# Patient Record
Sex: Male | Born: 1960 | Race: White | Hispanic: No | Marital: Married | Smoking: Current every day smoker
Health system: Southern US, Community
[De-identification: ages and names within clinical notes are randomized; demographics above are authoritative.]

## PROBLEM LIST (undated history)

## (undated) DIAGNOSIS — G459 Transient cerebral ischemic attack, unspecified: Secondary | ICD-10-CM

## (undated) DIAGNOSIS — I639 Cerebral infarction, unspecified: Secondary | ICD-10-CM

---

## 2005-02-26 ENCOUNTER — Ambulatory Visit: Payer: Self-pay | Admitting: Internal Medicine

## 2005-06-01 ENCOUNTER — Other Ambulatory Visit: Payer: Self-pay

## 2005-06-01 ENCOUNTER — Inpatient Hospital Stay: Payer: Self-pay | Admitting: Internal Medicine

## 2007-11-15 ENCOUNTER — Emergency Department: Payer: Self-pay | Admitting: Emergency Medicine

## 2009-06-09 ENCOUNTER — Inpatient Hospital Stay (HOSPITAL_COMMUNITY): Admission: EM | Admit: 2009-06-09 | Discharge: 2009-06-10 | Payer: Self-pay | Admitting: Emergency Medicine

## 2009-06-09 ENCOUNTER — Ambulatory Visit: Payer: Self-pay | Admitting: Family Medicine

## 2009-07-07 ENCOUNTER — Inpatient Hospital Stay: Payer: Self-pay | Admitting: Internal Medicine

## 2010-06-05 ENCOUNTER — Other Ambulatory Visit: Admission: RE | Admit: 2010-06-05 | Discharge: 2010-06-05 | Payer: Self-pay | Admitting: Otolaryngology

## 2011-03-22 LAB — CBC
HCT: 47.3 % (ref 39.0–52.0)
HCT: 48.6 % (ref 39.0–52.0)
Hemoglobin: 16.1 g/dL (ref 13.0–17.0)
MCHC: 34.1 g/dL (ref 30.0–36.0)
MCV: 92.5 fL (ref 78.0–100.0)
MCV: 92.6 fL (ref 78.0–100.0)
Platelets: 286 10*3/uL (ref 150–400)
RBC: 5.11 MIL/uL (ref 4.22–5.81)
RBC: 5.25 MIL/uL (ref 4.22–5.81)
RDW: 16.5 % — ABNORMAL HIGH (ref 11.5–15.5)
WBC: 10.3 10*3/uL (ref 4.0–10.5)

## 2011-03-22 LAB — RAPID URINE DRUG SCREEN, HOSP PERFORMED
Amphetamines: NOT DETECTED
Benzodiazepines: NOT DETECTED
Cocaine: NOT DETECTED
Opiates: NOT DETECTED
Tetrahydrocannabinol: NOT DETECTED

## 2011-03-22 LAB — COMPREHENSIVE METABOLIC PANEL
ALT: 17 U/L (ref 0–53)
AST: 22 U/L (ref 0–37)
Alkaline Phosphatase: 67 U/L (ref 39–117)
BUN: 9 mg/dL (ref 6–23)
CO2: 22 mEq/L (ref 19–32)
CO2: 31 mEq/L (ref 19–32)
Calcium: 8.9 mg/dL (ref 8.4–10.5)
Chloride: 107 mEq/L (ref 96–112)
Creatinine, Ser: 0.91 mg/dL (ref 0.4–1.5)
GFR calc Af Amer: >60 mL/min (ref >60)
GFR calc non Af Amer: >60 mL/min (ref >60)
GFR calc non Af Amer: >60 mL/min (ref >60)
Glucose, Bld: 97 mg/dL (ref 70–99)
Sodium: 140 mEq/L (ref 135–145)
Total Bilirubin: 0.7 mg/dL (ref 0.3–1.2)
Total Protein: 5.9 g/dL — ABNORMAL LOW (ref 6.0–8.3)

## 2011-03-22 LAB — DIFFERENTIAL
Basophils Absolute: 0.1 10*3/uL (ref 0.0–0.1)
Eosinophils Absolute: 0.2 10*3/uL (ref 0.0–0.7)
Eosinophils Relative: 2 % (ref 0–5)

## 2011-03-22 LAB — POCT I-STAT, CHEM 8
Chloride: 107 mEq/L (ref 96–112)
Creatinine, Ser: 0.8 mg/dL (ref 0.4–1.5)
Glucose, Bld: 104 mg/dL — ABNORMAL HIGH (ref 70–99)
HCT: 52 % (ref 39.0–52.0)
Potassium: 3.9 mEq/L (ref 3.5–5.1)

## 2011-03-22 LAB — POCT CARDIAC MARKERS
CKMB, poc: <1 ng/mL — ABNORMAL LOW (ref 1.0–8.0)
Myoglobin, poc: 84.4 ng/mL (ref 12–200)
Troponin i, poc: <0.05 ng/mL (ref 0.00–0.09)

## 2011-03-22 LAB — URINALYSIS, ROUTINE W REFLEX MICROSCOPIC
Bilirubin Urine: NEGATIVE
Nitrite: NEGATIVE
Protein, ur: NEGATIVE mg/dL
Specific Gravity, Urine: 1.026 (ref 1.005–1.030)
Urobilinogen, UA: 0.2 mg/dL (ref 0.0–1.0)

## 2011-03-22 LAB — HEMOGLOBIN A1C: Hgb A1c MFr Bld: 5.2 % (ref 4.6–6.1)

## 2011-03-22 LAB — LIPID PANEL
Cholesterol: 173 mg/dL (ref 0–200)
LDL Cholesterol: 118 mg/dL — ABNORMAL HIGH (ref 0–99)

## 2011-03-22 LAB — PROTIME-INR
INR: 1 (ref 0.00–1.49)
Prothrombin Time: 13.2 seconds (ref 11.6–15.2)
Prothrombin Time: 13.6 seconds (ref 11.6–15.2)

## 2011-03-22 LAB — CARDIAC PANEL(CRET KIN+CKTOT+MB+TROPI): CK, MB: 2.9 ng/mL (ref 0.3–4.0)

## 2011-03-22 LAB — URINE MICROSCOPIC-ADD ON

## 2011-04-27 NOTE — H&P (Signed)
Timothy Griffin, Timothy Griffin             ACCOUNT NO.:  [id number]   MEDICAL RECORD NO.:  [id number]          PATIENT TYPE:  INP   LOCATION:  3735                         FACILITY:  [facility]   PHYSICIAN:  Timothy Griffin, Timothy GriffinDATE OF BIRTH:  [birth year]   DATE OF ADMISSION:  06/09/2009  DATE OF DISCHARGE:                              HISTORY & PHYSICAL   PRIMARY CARE PHYSICIAN:  The patient is unassigned but has been followed  by Timothy Griffin, Internal Medicine.   CHIEF COMPLAINT:  Chest pain.   HISTORY OF PRESENT ILLNESS:  Timothy Griffin is a 50 year old male with a  history of right-sided TIA in 2003, hyperlipidemia, and family history  of coronary artery disease that presented with complaint of substernal  chest pain at rest that began yesterday morning that has waxed and waned  and today began having left-sided upper and lower extremity weakness  that is slowly improved.  He has had no altered mental status or speech  difficulties.  He was seen in the emergency department by Neuro and  found to have negative stroke workup.  He denies fever, chills, history  of trauma, nausea and vomiting.   PAST MEDICAL HISTORY:  1. Hyperlipidemia.  2. TIA in 2003.   MEDICATIONS:  None.   ALLERGIES:  No known drug allergies.   PAST SURGICAL HISTORY:  1. Appendectomy.  2. Tonsillectomy.   SOCIAL HISTORY:  The patient lives with his sister in [place].  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  He endorses 1-pack-per-day tobacco  use x30 years.  He also endorses a couple of 6-packs of beer imbibed  plus 3-5 mixed drinks each weekend.  He admits that he did drink alcohol  yesterday.  He denies any recreational drugs.   Family history is significant for cancer and cardiac disease.   REVIEW OF SYSTEMS:  Per HPI, otherwise, negative.   PHYSICAL EXAMINATION:  VITAL SIGNS:  Temperature 97.4, pulse 76,  respiratory rate 20, blood pressure 146/91, pulse ox 97% on 2 liters.  GENERAL:  He is alert and  oriented x3, in no apparent distress.  HEENT:  Normocephalic, atraumatic.  Pupils are equally round and  reactive to light.  Extraocular muscles are intact.  Oropharynx is pink  and moist.  NECK:  Supple with no masses.  HEART:  Regular rate and rhythm.  No murmurs, rubs, or gallops.  LUNGS:  Clear to auscultation bilaterally.  ABDOMEN:  Soft, nontender, nondistended with bowel sounds x4.  EXTREMITIES:  No cyanosis, clubbing, or edema.  He does have venous  stasis changes.  NEUROLOGIC:  Cranial nerves II through XII are intact.  He has no  obvious focal weakness.  SKIN:  No lesions.  PSYCH:  Increased level of anxiety.   LABORATORIES AND STUDIES:  1. Stroke panel was negative.  2. Point-of-care cardiac enzymes negative.  3. MRI/MRA of the head negative.  4. CT of the head negative.  5. Chest x-ray negative.   ASSESSMENT AND PLAN:  This patient is a 50 year old male admitted for  chest pain and subjective focal weakness.  1. Chest pain.  The patient does  have positive risk factors.  EKG,      chest x-ray, cardiac enzymes have been negative.  His chest pain is      not likely cardiac, but we will observe him overnight for rule out      by cycling cardiac enzymes, placing him on telemetry and monitoring      for changes, and providing nitroglycerin and morphine  as well as      oxygen as needed.  We will start him on aspirin 81.  I will recheck      a lipid panel in the morning as well as an A1c.  This chest pain is      likely GI versus anxiety in etiology.  We will also treat him with      a PPI and start Lexapro with alprazolam as needed.  2. Neuro.  The patient has a negative workup thus far.  MRI/MRA and      head CT have been negative.  The patient had seen and followed by      Neuro in the ED.  We will control his risk factors including      smoking cessation and counseling of starting aspirin as above.  3. Hypertension.  The patient's blood pressure is elevated.  We will       start Toprol 12.5 mg b.i.d.  4. Hyperlipidemia.  We will check a fasting lipid panel, and we will      go ahead and start Lipitor 20 mg.  5. Tobacco use.  The patient endorses tobacco use.  We will order a      nicotine patch and smoking cessation for this hospitalization.  6. Fluids, electrolytes, nutrition/gastrointestinal.  Heart-healthy      diet.  7. Prophylaxis.  PPI and Lovenox.  8. Disposition.  The patient is full code.  We will monitor him on      telemetry and likely to see in the morning.      Timothy Rima, MD  Electronically Signed      Timothy Griffin, M.D.  Electronically Signed    EW/MEDQ  D:  06/10/2009  T:  06/10/2009  Job:  [id number]

## 2011-04-27 NOTE — Consult Note (Signed)
Timothy Griffin, Timothy Griffin NO.:  [id number]   MEDICAL RECORD NO.:  [id number]          PATIENT TYPE:  INP   LOCATION:  3735                         FACILITY:  [facility]   PHYSICIAN:  Melvyn Novas, M.D.  DATE OF BIRTH:  1961/10/09   DATE OF CONSULTATION:  06/09/2009  DATE OF DISCHARGE:                                 CONSULTATION   This is a 50 year old with a date of birth 04/27/1961, right-  handed Caucasian male recently separated from his spouse, living with  his sister.  Today on June 09, 2009, at 1215, he called EMS and arrived  at the [facility] ER at about 1240 from [facility].    He presented with severe neck, chest and jaw pain on the left side and  stated that already yesterday after a weekend on the beach, he had  suffered some shortness of breath and some chest tightness.  He  described at 1215 hours today an acute onset of a profound numbness  throughout the left side of his body, not involving the face or any  cranial nerve functions.  In the ER, he was examined by Dollene Cleveland  at 1315 hours and she could also not elicit any cranial nerve  involvement.  He was mentally completely with [place], able to speak clearly,  had no sign of dysarthria or aphasia, but had left-sided pronator drift  and his left leg drift as well in the antigravity test.   Again, she confirms a profound numbness.   An EKG was read as normal.  A CT showed possible calcification in the  right MCA but no obstructive plaques and no acute stroke changes were  noted.  We decided to score this patient with the NIH Stroke Scale at 6  points, the ranking would be 0-1, and the patient's social history also  suggested some possible psychogenic overlay.  He just separated this  month from his wife of over a decade, works as a Nutritional therapist, has moved in  with his sister, and apparently helped his sister recently with some  renovation work.  He carried a hot water heater for her, which may  have  exceeded a healthy weight to lift for him.  He is a nonsmoker according to the ER records, I have not verified this  in person.    He is an occasional social drinker.   FAMILY HISTORY:  Hypertension.   PAST MEDICAL HISTORY:  The patient states that at one time he had a TIA  or stroke-like event, cannot dated and stated that he received all of  care at [facility] and at the [facility].  He also stated that his primary care physician is at the [facility], whose name he cannot recall, stopped all meds including aspirin,  Plavix, and Zocor.   The patient denies any recent trauma.   Review of systems is positive for profound numbness, a chest tightness,  a feeling of a balloon sticking in his left neck.  He also states that  he has a feeling of heaviness throughout the left body besides the  numbness and provides surprisingly a fairly good grip strength and  downgoing toes to plantar stimulation.  He has no evidence of previous neck surgery.  Again, no recent trauma.  Denies migraine, vertigo, degenerative disk disease, or coronary artery  disease.   PHYSICAL EXAMINATION:  VITAL SIGNS:  Stable vital signs with a blood  pressure of 148/80, heart rate of 86, respiratory rate of 20.  LUNGS:  Clear to auscultation.  NECK:  No carotid bruit.  CARDIOVASCULAR:  No cardiac irregular rhythm.  ABDOMEN:  Soft, nontender with positive bowel sounds.  EXTREMITIES:  No peripheral clubbing, cyanosis, or edema.  SKIN:  The patient is dark and tan, shows no clubbing or any rash or  bruising.  NEUROLOGIC:  Cranial nerve exam is entirely normal including visual  fields, extraocular movements, pupillary reaction, the ability to move  his eyebrows and his lower face and sensory for the face.  He also has  no hearing, taste, or smell sensation loss.  Motor examination would be  4/5 for the left arm and 4-3/5 for the left leg.  Sensory, the patient  has a profound numbness, but he  states as close to the midline.  This numbness is for pinprick, temperature, and vibration as well as  fine touch and does not involve the cranial nerves.  Gait was deferred.    Coordination by finger-nose showed left-sided drift rather than ataxia  or dysmetria, and he has no tremor at baseline.   The patient will be treated as a code stroke.  We will order an MRI of  the brain with diffusion-weighted image to first make sure that we are  not missing a possible TPA window.  If there is no stroke signs seen by the diffusion-weighted image, we  will decide to go to a full brain and an MRA of the neck to rule out  dissection or other arteriovenous malformation, etc., related sensory  loss.  I also keep in mind that this could be a functional deficit.  Since the patient has no primary care physician in this system, he will  be depending on the outcome of these tests either admitted to Stroke MD  or to the Hospitalist Service.      Melvyn Novas, M.D.  Electronically Signed     CD/MEDQ  D:  06/09/2009  T:  06/10/2009  Job:  [id number]   cc:   Pramod P. Pearlean Brownie, MD

## 2011-04-30 NOTE — Discharge Summary (Signed)
Timothy Griffin, Timothy Griffin NO.:  [id number]   MEDICAL RECORD NO.:  [id number]          PATIENT TYPE:  INP   LOCATION:  3735                         FACILITY:  [facility]   PHYSICIAN:  Paula Compton, MD        DATE OF BIRTH:  July 09, 1961   DATE OF ADMISSION:  06/10/2009  DATE OF DISCHARGE:  06/11/2009                               DISCHARGE SUMMARY   PRIMARY CARE PHYSICIAN:  Dr. Yates Decamp, Internal Medicine.   DISCHARGE DIAGNOSES:  1. Atypical chest pain.  2. Hypertension.  3. History of transient ischemic attack.  4. Anxiety and depression.  5. Tobacco dependence.  6. Alcohol abuse.   DISCHARGE MEDICATIONS:  1. Aspirin 81 mg by mouth daily.  2. Protonix 40 mg by mouth daily.  3. Zocor 20 mg by mouth daily.  4. Lisinopril 20 mg by mouth daily.   He was also instructed to stop smoking.   IMAGES:  Chest x-ray, CT of the head, and MRI of the head on June 01, 2009, were all negative for acute processes.  CMP within normal limits.  Cholesterol 173, triglyceride 119, HDL 31, and LDL 118.  CBC within  normal limits.  Cardiac enzymes negative x2.  Hemoglobin A1c 5.2.  Urine  drug screen negative.   ASSESSMENT/PLAN:  Timothy Griffin is a 50 year old male that presented to  the hospital with chest pain and the symptoms of stroke.  He underwent a  code stroke and was evaluated by Neurology who cleared him for any signs  of actually having a stroke.  Prior to discharge in the emergency  department, he was complaining of chest pain and so he was admitted for  our service for observation overnight and chest pain rule out.  Please  see H&P for further details of this.  1. Chest pain:  The patient's chest pain was atypical in nature.  He      was admitted overnight and observed for evidence of acute cardiac      syndrome.  He had no events on telemetry overnight.  No changes on      EKG in the morning.  He underwent cardiac enzymes that were all      negative.  He was risk  stratified, and Zocor was initiated for his      hyperlipidemia.  He was also counseled on smoking cessation and      alcohol cessation.  He was instructed to continue aspirin, Zocor,      and lisinopril.  2. Hypertension:  Please see #1.  3. History of transient ischemic attack:  Again, the patient had a      stroke workup in the emergency department prior to admission which      was all negative.  The patient states that he was on Plavix at some      point and was recommended to be followed up on an outpatient basis      with his primary care physician.  4. Anxiety and depression:  The patient admitted to many social      stressors in his life at this  time including separate from his wife      and having issues with his girlfriend.  It was thought that his      anxiety had a major role in his physical symptoms for this      admission.  Because the patient exhibited some signs of possible      bipolar illness, he was not started on antidepressant medication      while in this hospital, but it is recommended that he follow up      with his primary care physician for this.  5. Tobacco abuse:  The patient was given smoking cessation counseling      and provided nicotine patch while in the hospital.  6. Alcohol:  The patient showed no signs or symptoms of withdrawal      during the hospitalization.  He was counseled on the effects of      alcohol on the heart.  7. Gastroesophageal reflux disease:  The patient may have had possible      dyspepsia component to his chest pain.  He was started on PPI while      in the hospital and discharged with the same medication.  Follow up      with Dr. Yates Decamp in 1-2 weeks to see if the chest pain has      resolved.      [name], MD  Electronically Signed      Paula Compton, MD  Electronically Signed    EW/MEDQ  D:  06/16/2009  T:  06/17/2009  Job:  [id number]

## 2011-06-22 ENCOUNTER — Emergency Department: Payer: Self-pay | Admitting: Emergency Medicine

## 2013-10-30 ENCOUNTER — Encounter (HOSPITAL_COMMUNITY): Payer: Self-pay | Admitting: Emergency Medicine

## 2013-10-30 ENCOUNTER — Inpatient Hospital Stay (HOSPITAL_COMMUNITY)
Admission: EM | Admit: 2013-10-30 | Discharge: 2013-10-31 | DRG: 103 | Disposition: A | Discharge status: Home / Self Care | Payer: Self-pay | Attending: Internal Medicine | Admitting: Internal Medicine

## 2013-10-30 ENCOUNTER — Emergency Department (HOSPITAL_COMMUNITY): Payer: Self-pay

## 2013-10-30 DIAGNOSIS — R209 Unspecified disturbances of skin sensation: Secondary | ICD-10-CM

## 2013-10-30 DIAGNOSIS — G459 Transient cerebral ischemic attack, unspecified: Secondary | ICD-10-CM

## 2013-10-30 DIAGNOSIS — R200 Anesthesia of skin: Secondary | ICD-10-CM

## 2013-10-30 DIAGNOSIS — I10 Essential (primary) hypertension: Secondary | ICD-10-CM | POA: Diagnosis present

## 2013-10-30 DIAGNOSIS — G43109 Migraine with aura, not intractable, without status migrainosus: Principal | ICD-10-CM | POA: Diagnosis present

## 2013-10-30 DIAGNOSIS — IMO0001 Reserved for inherently not codable concepts without codable children: Secondary | ICD-10-CM

## 2013-10-30 DIAGNOSIS — F17200 Nicotine dependence, unspecified, uncomplicated: Secondary | ICD-10-CM | POA: Diagnosis present

## 2013-10-30 DIAGNOSIS — Z8673 Personal history of transient ischemic attack (TIA), and cerebral infarction without residual deficits: Secondary | ICD-10-CM

## 2013-10-30 HISTORY — DX: Transient cerebral ischemic attack, unspecified: G45.9

## 2013-10-30 HISTORY — DX: Cerebral infarction, unspecified: I63.9

## 2013-10-30 LAB — CBC WITH DIFFERENTIAL/PLATELET
Basophils Absolute: 0.1 10*3/uL (ref 0.0–0.1)
Eosinophils Absolute: 0.2 10*3/uL (ref 0.0–0.7)
Eosinophils Relative: 2 % (ref 0–5)
HCT: 44.9 % (ref 39.0–52.0)
Lymphocytes Relative: 27 % (ref 12–46)
MCH: 30.6 pg (ref 26.0–34.0)
MCHC: 34.5 g/dL (ref 30.0–36.0)
MCV: 88.6 fL (ref 78.0–100.0)
Monocytes Absolute: 0.7 10*3/uL (ref 0.1–1.0)
Monocytes Relative: 6 % (ref 3–12)
Platelets: 299 10*3/uL (ref 150–400)
RBC: 5.07 MIL/uL (ref 4.22–5.81)
RDW: 14.6 % (ref 11.5–15.5)
WBC: 10.4 10*3/uL (ref 4.0–10.5)

## 2013-10-30 LAB — COMPREHENSIVE METABOLIC PANEL
AST: 20 U/L (ref 0–37)
BUN: 10 mg/dL (ref 6–23)
CO2: 23 mEq/L (ref 19–32)
Calcium: 9.3 mg/dL (ref 8.4–10.5)
Creatinine, Ser: 0.91 mg/dL (ref 0.50–1.35)
GFR calc Af Amer: >90 mL/min (ref >90)
GFR calc non Af Amer: >90 mL/min (ref >90)
Sodium: 136 mEq/L (ref 135–145)
Total Bilirubin: 0.5 mg/dL (ref 0.3–1.2)
Total Protein: 7.3 g/dL (ref 6.0–8.3)

## 2013-10-30 LAB — PROTIME-INR
INR: 1.05 (ref 0.00–1.49)
Prothrombin Time: 13.5 seconds (ref 11.6–15.2)

## 2013-10-30 LAB — TROPONIN I: Troponin I: <0.3 ng/mL (ref <0.30)

## 2013-10-30 MED ORDER — PROCHLORPERAZINE EDISYLATE 5 MG/ML IJ SOLN
10.0000 mg | Freq: Once | INTRAMUSCULAR | Status: AC
  Administered 2013-10-30: 10 mg via INTRAVENOUS
  Filled 2013-10-30: qty 2

## 2013-10-30 MED ORDER — ASPIRIN 325 MG PO TABS
325.0000 mg | ORAL_TABLET | Freq: Every day | ORAL | Status: DC
  Administered 2013-10-30 – 2013-10-31 (×2): 325 mg via ORAL
  Filled 2013-10-30 (×2): qty 1

## 2013-10-30 MED ORDER — ENOXAPARIN SODIUM 40 MG/0.4ML ~~LOC~~ SOLN
40.0000 mg | SUBCUTANEOUS | Status: DC
  Administered 2013-10-30: 40 mg via SUBCUTANEOUS
  Filled 2013-10-30 (×2): qty 0.4

## 2013-10-30 MED ORDER — MAGNESIUM SULFATE 40 MG/ML IJ SOLN
2.0000 g | Freq: Once | INTRAMUSCULAR | Status: AC
  Administered 2013-10-30: 2 g via INTRAVENOUS
  Filled 2013-10-30: qty 50

## 2013-10-30 MED ORDER — VALPROATE SODIUM 500 MG/5ML IV SOLN
1000.0000 mg | Freq: Once | INTRAVENOUS | Status: DC
  Filled 2013-10-30: qty 10

## 2013-10-30 MED ORDER — SENNOSIDES-DOCUSATE SODIUM 8.6-50 MG PO TABS
1.0000 | ORAL_TABLET | Freq: Every evening | ORAL | Status: DC | PRN
  Filled 2013-10-30: qty 1

## 2013-10-30 MED ORDER — METOCLOPRAMIDE HCL 5 MG/ML IJ SOLN
10.0000 mg | Freq: Once | INTRAMUSCULAR | Status: AC
  Administered 2013-10-30: 10 mg via INTRAVENOUS
  Filled 2013-10-30: qty 2

## 2013-10-30 MED ORDER — DIPHENHYDRAMINE HCL 50 MG/ML IJ SOLN
25.0000 mg | Freq: Once | INTRAMUSCULAR | Status: AC
  Administered 2013-10-30: 25 mg via INTRAVENOUS
  Filled 2013-10-30: qty 1

## 2013-10-30 MED ORDER — VALPROATE SODIUM 500 MG/5ML IV SOLN
1000.0000 mg | Freq: Once | INTRAVENOUS | Status: AC
  Administered 2013-10-30: 1000 mg via INTRAVENOUS
  Filled 2013-10-30: qty 10

## 2013-10-30 MED ORDER — KETOROLAC TROMETHAMINE 30 MG/ML IJ SOLN
30.0000 mg | Freq: Once | INTRAMUSCULAR | Status: AC
  Administered 2013-10-30: 30 mg via INTRAVENOUS
  Filled 2013-10-30: qty 1

## 2013-10-30 MED ORDER — SODIUM CHLORIDE 0.9 % IV SOLN
INTRAVENOUS | Status: DC
  Administered 2013-10-30 (×2): via INTRAVENOUS

## 2013-10-30 NOTE — ED Provider Notes (Signed)
CSN: [id number]     Arrival date & time 10/30/13  1207 History   First MD Initiated Contact with Patient 10/30/13 1229     Chief Complaint  Patient presents with  . Altered Mental Status  . Extremity Weakness  . Numbness   (Consider location/radiation/quality/duration/timing/severity/associated sxs/prior Treatment) HPI The patient reports he woke up at 5 AM this morning with right-sided chest pain. He states his right hand started feeling numb. He reports his right side of his face started getting numb 2 hours ago and about one hour ago he started getting right sided sharp headache that is severe. He states currently his right leg is tingling. He states he drove himself to the ED and he has been ambulatory. He states when he tried to write at work he he was having difficulty with holding his hand. He complains of blurred vision from his right eye. He states he feels dizzy meaning lightheadedness without spinning. He states his chest pain is gone at this time.  Patient states he was diagnosed with a TIA several years ago and used to be on blood pressure medication however he lost weight and he currently does not take any medications. Patient states his brother died 2 months ago of a brain aneurysm and he also had hypertension. He states his father in all of his father's siblings which were 13 died of cardiac disease.  PCP in [place]  Past Medical History  Diagnosis Date  . TIA (transient ischemic attack)    No past surgical history on file. No family history on file. History  Substance Use Topics  . Smoking status: Current Every Day Smoker -- 1.00 packs/day  . Smokeless tobacco: Not on file  . Alcohol Use: Yes     Comment: Occasional  employed  Review of Systems  All other systems reviewed and are negative.    Allergies  Penicillins  Home Medications   Current Outpatient Rx  Name  Route  Sig  Dispense  Refill  . diphenhydrAMINE (BENADRYL) 25 MG tablet   Oral   Take 25 mg  by mouth every 6 (six) hours as needed (allergies).    2 days ago      BP 140/67  Pulse 57  Temp(Src) 98.3 F (36.8 C) (Oral)  Resp 15  SpO2 98%  Vital signs normal   Physical Exam  Nursing note and vitals reviewed. Constitutional: He is oriented to person, place, and time. He appears well-developed and well-nourished.  Non-toxic appearance. He does not appear ill. No distress.  HENT:  Head: Normocephalic and atraumatic.  Right Ear: External ear normal.  Left Ear: External ear normal.  Nose: Nose normal. No mucosal edema or rhinorrhea.  Mouth/Throat: Oropharynx is clear and moist and mucous membranes are normal. No dental abscesses or uvula swelling.  Eyes: Conjunctivae and EOM are normal. Pupils are equal, round, and reactive to light.  Neck: Normal range of motion and full passive range of motion without pain. Neck supple.  No carotid bruits  Cardiovascular: Normal rate, regular rhythm and normal heart sounds.  Exam reveals no gallop and no friction rub.   No murmur heard. Pulmonary/Chest: Effort normal and breath sounds normal. No respiratory distress. He has no wheezes. He has no rhonchi. He has no rales. He exhibits no tenderness and no crepitus.  Abdominal: Soft. Normal appearance and bowel sounds are normal. He exhibits no distension. There is no tenderness. There is no rebound and no guarding.  Musculoskeletal: Normal range of motion. He exhibits  no edema and no tenderness.  Moves all extremities well.   Neurological: He is alert and oriented to person, place, and time. He has normal strength. No cranial nerve deficit.  No facial asymmetry, no pronator drift, mild grip weakness on the right, patient has some difficulty raising his right leg he is able to raise it against gravity and hold it. His finger to nose is equal and coordinated bilaterally. His visual field are markedly diminished on the right lateral and inferior aspect  Skin: Skin is warm, dry and intact. No rash  noted. No erythema. No pallor.  Psychiatric: He has a normal mood and affect. His speech is normal and behavior is normal. His mood appears not anxious.    ED Course  Procedures (including critical care time)  Medications  0.9 %  sodium chloride infusion ( Intravenous New Bag/Given 10/30/13 1406)  magnesium sulfate IVPB 2 g 50 mL (not administered)  valproate (DEPACON) 1,000 mg in dextrose 5 % 50 mL IVPB (not administered)  metoCLOPramide (REGLAN) injection 10 mg (10 mg Intravenous Given 10/30/13 1400)  diphenhydrAMINE (BENADRYL) injection 25 mg (25 mg Intravenous Given 10/30/13 1359)   13:33 Dr Roseanne Reno, does not feel meets criteria for code stroke, have hospitalist admit at [facility]States he can have his headache treated.   13:48 Radiologist called CT results.  Pt given results of his CT and need for MRI and he is agreeable.   16:07 Dr Roseanne Reno feels he needs admission for stroke work up.   16:55 Dr Blake Divine, admit to [facility], tele, she will arrange transfer.   Labs Review Results for orders placed during the hospital encounter of 10/30/13  CBC WITH DIFFERENTIAL      Result Value Range   WBC 10.4  4.0 - 10.5 K/uL   RBC 5.07  4.22 - 5.81 MIL/uL   Hemoglobin 15.5  13.0 - 17.0 g/dL   HCT 13.2  44.0 - 10.2 %   MCV 88.6  78.0 - 100.0 fL   MCH 30.6  26.0 - 34.0 pg   MCHC 34.5  30.0 - 36.0 g/dL   RDW 72.5  36.6 - 44.0 %   Platelets 299  150 - 400 K/uL   Neutrophils Relative % 64  43 - 77 %   Neutro Abs 6.7  1.7 - 7.7 K/uL   Lymphocytes Relative 27  12 - 46 %   Lymphs Abs 2.8  0.7 - 4.0 K/uL   Monocytes Relative 6  3 - 12 %   Monocytes Absolute 0.7  0.1 - 1.0 K/uL   Eosinophils Relative 2  0 - 5 %   Eosinophils Absolute 0.2  0.0 - 0.7 K/uL   Basophils Relative 1  0 - 1 %   Basophils Absolute 0.1  0.0 - 0.1 K/uL  APTT      Result Value Range   aPTT 33  24 - 37 seconds  PROTIME-INR      Result Value Range   Prothrombin Time 13.5  11.6 - 15.2 seconds   INR 1.05  0.00 - 1.49    Laboratory interpretation all normal except CMET pending  Imaging Review Ct Head Wo Contrast  10/30/2013   ADDENDUM REPORT: 10/30/2013 13:49  ADDENDUM: Critical Value/emergent results were called by telephone at the time of interpretation on 10/30/2013 at 1:48 PM to Dr.Tandy Grawe , who verbally acknowledged these results.   Electronically Signed   By: Elige Ko   On: 10/30/2013 13:49   10/30/2013   CLINICAL DATA:  Right-sided numbness  EXAM: CT HEAD WITHOUT CONTRAST  TECHNIQUE: Contiguous axial images were obtained from the base of the skull through the vertex without intravenous contrast.  COMPARISON:  MR brain 06/09/2009  FINDINGS: There is no evidence of mass effect, midline shift or extra-axial fluid collections. There is no evidence of a space-occupying lesion or intracranial hemorrhage. There is no evidence of a cortical-based area of acute infarction.  The ventricles and sulci are appropriate for the patient's age. The basal cisterns are patent.  Visualized portions of the orbits are unremarkable. The visualized portions of the paranasal sinuses and mastoid air cells are unremarkable.  The osseous structures are unremarkable.  IMPRESSION: No acute intracranial pathology.  Electronically Signed: By: Elige Ko On: 10/30/2013 13:36   Mr [facility] Wo Contrast  Mr Brain Wo Contrast  10/30/2013   CLINICAL DATA:  Right-sided numbness. Evaluate for aneurysm versus stroke.  EXAM: MRI HEAD WITHOUT CONTRAST  MRA HEAD WITHOUT CONTRAST  TECHNIQUE: Multiplanar, multiecho pulse sequences of the brain and surrounding structures were obtained without intravenous contrast. Angiographic images of the head were obtained using MRA technique without contrast.  COMPARISON:  Head CT 10/30/2013.  Brain MRI and MRA 06/09/2009.  FINDINGS: MRI HEAD FINDINGS  There are scattered, small foci of T2 hyperintensity within the subcortical and deep cerebral white matter bilaterally, mildly increased since the prior MRI.  Ventricles and sulci are normal. There is no evidence of acute infarct, intracranial hemorrhage, mass, midline shift, or extra-axial fluid collection. Orbits are unremarkable. Minimal bilateral maxillary sinus mucosal thickening is noted.  MRA HEAD FINDINGS  Visualized distal vertebral arteries are patent. Left vertebral artery is slightly dominant. PICA origins are patent. Basilar artery is patent without stenosis. Common origin of the SCAs and PCAs. PCAs are patent without evidence of stenosis. Internal carotid arteries are patent from skullbase to carotid terminus. ACA and MCA origins are patent. MCAs are unremarkable bilaterally. Left ACA is hypoplastic. ACAs are otherwise unremarkable. Anterior and posterior communicating arteries are not identified.  IMPRESSION: 1. No evidence of acute infarct or other acute intracranial abnormality. 2. Scattered foci of white matter T2 signal abnormality, slightly increased from prior study and suggestive of mild chronic small vessel ischemic disease. 3. Unremarkable intracranial MRA without evidence of high-grade stenosis or aneurysm.   Electronically Signed   By: Sebastian Ache   On: 10/30/2013 15:30    EKG Interpretation   None      Date: 10/30/2013  Rate: 83  Rhythm: normal sinus rhythm  QRS Axis: normal  Intervals: normal  ST/T Wave abnormalities: normal  Conduction Disutrbances:nonspecific intraventricular conduction delay  Narrative Interpretation:   Old EKG Reviewed: none available     MDM   1. Numbness and tingling in right hand   2. Numbness and tingling of right arm   3. Numbness and tingling of right face   4. Complicated migraine     Plan admission to [facility]   Devoria Albe, MD, Armando Gang    Ward Givens, MD 10/30/13 [phone]

## 2013-10-30 NOTE — Consult Note (Addendum)
Neurology Consultation Reason for Consult: Right-sided numbness Referring Physician: Kathlen Mody     CC: Right-sided numbness  History is obtained from: Patient  HPI: Timothy Griffin is a 52 y.o. male with a history of previously diagnosed "TIAs" who presents with right-sided numbness and tingling that started in his right arm this morning and then slowly over the course of hours spread up to his face. He developed a throbbing headache associated with photophobia which is persistent at this time, though improved some with Reglan and Benadryl in the ER earlier.  His symptoms have improved, though he still has some blurred vision to the right as well as tingling in his right hand. The headache also improved, but is still present and seems to be returning.   LKW: 4:30 AM tpa given?: no, outside of window    ROS: A 14 point ROS was performed and is negative except as noted in the HPI.  Past Medical History  Diagnosis Date  . TIA (transient ischemic attack)     Family History: Father, brother-aneurysms Sister-brain tumor  Social History: Tob: Current everyday smoker  Exam: Current vital signs: BP 155/83  Pulse 56  Temp(Src) 97.7 F (36.5 C) (Oral)  Resp 18  SpO2 97% Vital signs in last 24 hours: Temp:  [97.7 F (36.5 C)-98.3 F (36.8 C)] 97.7 F (36.5 C) (11/18 1917) Pulse Rate:  [33-81] 56 (11/18 1917) Resp:  [13-22] 18 (11/18 1917) BP: (140-182)/(67-107) 155/83 mmHg (11/18 1917) SpO2:  [96 %-98 %] 97 % (11/18 1917)  General: In bed, NAD CV: Regular rate and rhythm Mental Status: Patient is awake, alert, oriented to person, place, month, year, and situation. Immediate and remote memory are intact. Patient is able to give a clear and coherent history. No signs of aphasia or neglect Cranial Nerves: II: Visual Fields are full to finger movement, however he has difficulty with counting fingers in the right upper visual field. Pupils are equal, round, and reactive  to light.  Discs are difficult to visualize. III,IV, VI: EOMI without ptosis or diploplia.  V: Facial sensation is symmetric to temperature VII: Facial movement is symmetric.  VIII: hearing is intact to voice X: Uvula elevates symmetrically XI: Shoulder shrug is symmetric. XII: tongue is midline without atrophy or fasciculations.  Motor: Tone is normal. Bulk is normal. 5/5 strength was present in left side, 4+/5 in right arm and leg, mild drift without pronation in the right arm Sensory: Sensation is decreased to temperature in the right arm and leg Deep Tendon Reflexes: 2+ and symmetric in the biceps and patellae.  Cerebellar: FNF are intact bilaterally Gait: Not tested secondary to multiple monitors.    I have reviewed labs in epic and the results pertinent to this consultation are: CMP-normal creatinine  I have reviewed the images obtained: MRI brain-negative  Impression: 52 year old male with headache and right arm tingling that progressed over the course of several hours to involve his face as well as blurred vision on the right side. The progression of symptoms, as was positive symptoms (tingling) and persistent symptoms in the setting of a negative brain MRI would argue strongly that this represents complicated migraine rather than TIA.  Though I suspect that his previous episodes were also complicated migraine, it is difficult for me to say with certainty and therefore daily baby aspirin may be reasonable given that it is a relatively cheap and low risk intervention. I would not pursue further TIA workup with this episode  Recommendations: 1) ASA 81 mg  daily,  2) will treat migraine with Compazine and Toradol 3) I have canceled TIA workup including lipid panel, A1c, echocardiogram, carotid Dopplers. If any of these are needed from a chest pain perspective, then would defer to internal medicine to reorder. 4) will likely need hypertension controlled, but could defer to PCP  given that migraine (pain) can cause elevations of blood pressure.    Ritta Slot, MD [facility] [phone]  If 7pm- 7am, please page neurology on call at [phone].

## 2013-10-30 NOTE — ED Notes (Signed)
Patient with Hx of TIA reports to ED for c/o right arm numbness moving into right shoulder, confusion, right anterior headache. Patient unable to state date, unable to spell name or remember birthday. Last seen normal yesterday.

## 2013-10-30 NOTE — ED Notes (Signed)
Devoria Albe, MD, made aware of patient's request for pain medicine.

## 2013-10-30 NOTE — ED Notes (Signed)
Patient transported to MRI 

## 2013-10-30 NOTE — H&P (Signed)
[facility] History and Physical  Arda Keadle ZOX:[id number] DOB: 10/11/1961 DOA: 10/30/2013  Referring physician: EDP  PCP: No primary provider on file.  Specialists: NONE  Chief Complaint: weakness of the right side and numbess of the right side of the face since 4 30 am.   HPI: Timothy Griffin is a 52 y.o. male with prior h/o hypertension, TIA, came in for right sided weakness and numbness and numbness of the right side of the face, started at 4 30 am , associated with chest pain substernal, intermittent. His symptoms didn't improve by 10AM , and he came to ED. He also reported slurred speech and some confusion on arrival to ED. His symptoms improved and he was referred to medical service for admissiion. His work up included CT head without contrast , MRI BRAIN and MRA hea dan neck, did not reveal any acute stroke. But threre were scattered foci of white matter suggestive fo small vessel ischemic disease. [facility] will be transferred [facility] for further evaluation of TIA. Dr Roseanne Reno aware of the pt 's transfer.   Review of Systems: The patient denies anorexia, fever, weight loss,, vision loss, decreased hearing, hoarseness,syncope, dyspnea on exertion, peripheral edema, b hemoptysis, abdominal pain, melena, hematochezia, severe indigestion/heartburn, hematuria, incontinence, genital sores,  suspicious skin lesions, transient blindness,  depression, unusual weight change, abnormal bleeding, enlarged lymph nodes, angioedema, and breast masses.    Past Medical History  Diagnosis Date  . TIA (transient ischemic attack)    No past surgical history on file. Social History:  reports that he has been smoking.  He does not have any smokeless tobacco history on file. He reports that he drinks alcohol. He reports that he does not use illicit drugs.  where does patient live--home,  Can patient participate in ADLs? yes  Allergies  Allergen Reactions  . Penicillins     Unknown.    No family  history on file.  Family h/o of brain aneurysms brother Aneurysm and rupture of the heart - father.   Prior to Admission medications   Medication Sig Start Date End Date Taking? Authorizing Provider  diphenhydrAMINE (BENADRYL) 25 MG tablet Take 25 mg by mouth every 6 (six) hours as needed (allergies).   Yes Historical Provider, MD   Physical Exam: Filed Vitals:   10/30/13 1730  BP: 161/90  Pulse: 56  Temp:   Resp:     Constitutional: Vital signs reviewed.  Patient is a well-developed and well-nourished  in no acute distress and cooperative with exam. Alert and oriented x3.  Head: Normocephalic and atraumatic Mouth: no erythema or exudates, MMM Eyes: PERRL, EOMI, conjunctivae normal, No scleral icterus.  Neck: Supple, Trachea midline normal ROM, No JVD, mass, thyromegaly, or carotid bruit present.  Cardiovascular: RRR, S1 normal, S2 normal, no MRG, pulses symmetric and intact bilaterally Pulmonary/Chest: normal respiratory effort, CTAB, no wheezes, rales, or rhonchi Abdominal: Soft. Non-tender, non-distended, bowel sounds are normal, no masses, organomegaly, or guarding present.  Musculoskeletal: No joint deformities, erythema, or stiffness, ROM full and no nontender Neurological: A&O x3, right UE/LE strength is 4/5 . Speech slightly thick, slight facial droop. Numbness over the right face present.  Skin: Warm, dry and intact. No rash, cyanosis, or clubbing.  Psychiatric: Normal mood and affect. speech and behavior is normal.    Labs on Admission:  Basic Metabolic Panel:  Recent Labs Lab 10/30/13 1601  NA 136  K 3.7  CL 102  CO2 23  GLUCOSE 104*  BUN 10  CREATININE 0.91  CALCIUM 9.3   Liver Function Tests:  Recent Labs Lab 10/30/13 1601  AST 20  ALT 18  ALKPHOS 70  BILITOT 0.5  PROT 7.3  ALBUMIN 4.2   No results found for this basename: LIPASE, AMYLASE,  in the last 168 hours No results found for this basename: AMMONIA,  in the last 168 hours CBC:  Recent  Labs Lab 10/30/13 1601  WBC 10.4  NEUTROABS 6.7  HGB 15.5  HCT 44.9  MCV 88.6  PLT 299   Cardiac Enzymes:  Recent Labs Lab 10/30/13 1601  TROPONINI <0.30    BNP (last 3 results) No results found for this basename: PROBNP,  in the last 8760 hours CBG: No results found for this basename: GLUCAP,  in the last 168 hours  Radiological Exams on Admission: Ct Head Wo Contrast  10/30/2013   ADDENDUM REPORT: 10/30/2013 13:49  ADDENDUM: Critical Value/emergent results were called by telephone at the time of interpretation on 10/30/2013 at 1:48 PM to Dr.[name] , who verbally acknowledged these results.   Electronically Signed   By: Elige Ko   On: 10/30/2013 13:49   10/30/2013   CLINICAL DATA:  Right-sided numbness  EXAM: CT HEAD WITHOUT CONTRAST  TECHNIQUE: Contiguous axial images were obtained from the base of the skull through the vertex without intravenous contrast.  COMPARISON:  MR brain 06/09/2009  FINDINGS: There is no evidence of mass effect, midline shift or extra-axial fluid collections. There is no evidence of a space-occupying lesion or intracranial hemorrhage. There is no evidence of a cortical-based area of acute infarction.  The ventricles and sulci are appropriate for the patient's age. The basal cisterns are patent.  Visualized portions of the orbits are unremarkable. The visualized portions of the paranasal sinuses and mastoid air cells are unremarkable.  The osseous structures are unremarkable.  IMPRESSION: No acute intracranial pathology.  Electronically Signed: By: Elige Ko On: 10/30/2013 13:36   Mr Maxine Glenn Head Wo Contrast  10/30/2013   CLINICAL DATA:  Right-sided numbness. Evaluate for aneurysm versus stroke.  EXAM: MRI HEAD WITHOUT CONTRAST  MRA HEAD WITHOUT CONTRAST  TECHNIQUE: Multiplanar, multiecho pulse sequences of the brain and surrounding structures were obtained without intravenous contrast. Angiographic images of the head were obtained using MRA technique  without contrast.  COMPARISON:  Head CT 10/30/2013.  Brain MRI and MRA 06/09/2009.  FINDINGS: MRI HEAD FINDINGS  There are scattered, small foci of T2 hyperintensity within the subcortical and deep cerebral white matter bilaterally, mildly increased since the prior MRI. Ventricles and sulci are normal. There is no evidence of acute infarct, intracranial hemorrhage, mass, midline shift, or extra-axial fluid collection. Orbits are unremarkable. Minimal bilateral maxillary sinus mucosal thickening is noted.  MRA HEAD FINDINGS  Visualized distal vertebral arteries are patent. Left vertebral artery is slightly dominant. PICA origins are patent. Basilar artery is patent without stenosis. Common origin of the SCAs and PCAs. PCAs are patent without evidence of stenosis. Internal carotid arteries are patent from skullbase to carotid terminus. ACA and MCA origins are patent. MCAs are unremarkable bilaterally. Left ACA is hypoplastic. ACAs are otherwise unremarkable. Anterior and posterior communicating arteries are not identified.  IMPRESSION: 1. No evidence of acute infarct or other acute intracranial abnormality. 2. Scattered foci of white matter T2 signal abnormality, slightly increased from prior study and suggestive of mild chronic small vessel ischemic disease. 3. Unremarkable intracranial MRA without evidence of high-grade stenosis or aneurysm.   Electronically Signed   By: Sebastian Ache  On: 10/30/2013 15:30   Mr Brain Wo Contrast  10/30/2013   CLINICAL DATA:  Right-sided numbness. Evaluate for aneurysm versus stroke.  EXAM: MRI HEAD WITHOUT CONTRAST  MRA HEAD WITHOUT CONTRAST  TECHNIQUE: Multiplanar, multiecho pulse sequences of the brain and surrounding structures were obtained without intravenous contrast. Angiographic images of the head were obtained using MRA technique without contrast.  COMPARISON:  Head CT 10/30/2013.  Brain MRI and MRA 06/09/2009.  FINDINGS: MRI HEAD FINDINGS  There are scattered, small  foci of T2 hyperintensity within the subcortical and deep cerebral white matter bilaterally, mildly increased since the prior MRI. Ventricles and sulci are normal. There is no evidence of acute infarct, intracranial hemorrhage, mass, midline shift, or extra-axial fluid collection. Orbits are unremarkable. Minimal bilateral maxillary sinus mucosal thickening is noted.  MRA HEAD FINDINGS  Visualized distal vertebral arteries are patent. Left vertebral artery is slightly dominant. PICA origins are patent. Basilar artery is patent without stenosis. Common origin of the SCAs and PCAs. PCAs are patent without evidence of stenosis. Internal carotid arteries are patent from skullbase to carotid terminus. ACA and MCA origins are patent. MCAs are unremarkable bilaterally. Left ACA is hypoplastic. ACAs are otherwise unremarkable. Anterior and posterior communicating arteries are not identified.  IMPRESSION: 1. No evidence of acute infarct or other acute intracranial abnormality. 2. Scattered foci of white matter T2 signal abnormality, slightly increased from prior study and suggestive of mild chronic small vessel ischemic disease. 3. Unremarkable intracranial MRA without evidence of high-grade stenosis or aneurysm.   Electronically Signed   By: Sebastian Ache   On: 10/30/2013 15:30    EKG: sinus at 83/min  Assessment/Plan Active Problems: 1.  TIA: - Admit to telemetry.  -TIA work up in progress.  - CT head, MRI and MRA negative for acute stroke.  - carotid duplex and lipid panel ordered.  - PT/OT /SLP ordered - aspirin 325 mg ordered.  - Dr Roseanne Reno aware of the pt's transfer.   2. Permissive Hypertension.   3. DVT Prophylaxis    Code Status: full code Family Communication: none at bedside Disposition Plan: pending. Admit to telemetry  Time spent: 75 min  Marna Weniger [facility] Pager [phone]  If 7PM-7AM, please contact night-coverage [email or web address] Password [facility] 10/30/2013, 5:55  PM

## 2013-10-31 ENCOUNTER — Inpatient Hospital Stay (HOSPITAL_COMMUNITY): Payer: Self-pay

## 2013-10-31 ENCOUNTER — Encounter (HOSPITAL_COMMUNITY): Payer: Self-pay | Admitting: *Deleted

## 2013-10-31 DIAGNOSIS — R030 Elevated blood-pressure reading, without diagnosis of hypertension: Secondary | ICD-10-CM

## 2013-10-31 DIAGNOSIS — G43109 Migraine with aura, not intractable, without status migrainosus: Principal | ICD-10-CM

## 2013-10-31 LAB — GLUCOSE, CAPILLARY: Glucose-Capillary: 80 mg/dL (ref 70–99)

## 2013-10-31 LAB — TROPONIN I: Troponin I: <0.3 ng/mL (ref <0.30)

## 2013-10-31 MED ORDER — IBUPROFEN 200 MG PO TABS: 400.0000 mg | ORAL_TABLET | Freq: Four times a day (QID) | ORAL | Status: AC | PRN

## 2013-10-31 MED ORDER — ASPIRIN EC 81 MG PO TBEC: 81.0000 mg | DELAYED_RELEASE_TABLET | Freq: Every day | ORAL | Status: AC

## 2013-10-31 NOTE — Progress Notes (Signed)
NEURO HOSPITALIST PROGRESS NOTE   SUBJECTIVE:                                                                                                                        Feeling much better this morning. Stated that the head pain " is only a dull pain in the right forehead and the numbness in my hands is also much better". MRI brain showed no acute abnormality and MRA brain is unremarkable.  OBJECTIVE:                                                                                                                           Vital signs in last 24 hours: Temp:  [97.7 F (36.5 C)-98.3 F (36.8 C)] 98.1 F (36.7 C) (11/19 0600) Pulse Rate:  [33-106] 106 (11/19 0600) Resp:  [13-22] 18 (11/19 0600) BP: (140-182)/(67-107) 160/88 mmHg (11/19 0600) SpO2:  [96 %-98 %] 97 % (11/19 0600)  Intake/Output from previous day: 11/18 0701 - 11/19 0700 In: 240 [P.O.:240] Out: -  Intake/Output this shift:   Nutritional status: General  Past Medical History  Diagnosis Date  . TIA (transient ischemic attack)     Neurologic Exam:  Mental Status:  Patient is awake, alert, oriented to person, place, month, year, and situation.  Immediate and remote memory are intact.   No aphasia or dysarthria.  Cranial Nerves:  II: Visual Fields are full to finger movement, however he has difficulty with counting fingers in the right upper visual field. Pupils are equal, round, and reactive to light. Discs are difficult to visualize.  III,IV, VI: EOMI without ptosis or diploplia.  V: Facial sensation is symmetric to temperature  VII: Facial movement is symmetric.  VIII: hearing is intact to voice  X: Uvula elevates symmetrically  XI: Shoulder shrug is symmetric.  XII: tongue is midline without atrophy or fasciculations.  Motor:  5/5 all over Sensory:  No tested Deep Tendon Reflexes:  2+ and symmetric in the biceps and patellae.  Cerebellar:  FNF are intact bilaterally   Gait:  Not tested secondary to multiple monitors   Lab Results: Lab Results  Component Value Date/Time   CHOL  Value: 173        ATP III  CLASSIFICATION:  <200     mg/dL   Desirable  811-914  mg/dL   Borderline High  >=782    mg/dL   High        9/56/2130  5:15 AM   Lipid Panel No results found for this basename: CHOL, TRIG, HDL, CHOLHDL, VLDL, LDLCALC,  in the last 72 hours  Studies/Results: Dg Chest 2 View  10/31/2013   CLINICAL DATA:  Transient ischemic attack.  EXAM: CHEST  2 VIEW  COMPARISON:  06/09/2009  FINDINGS: The heart size and pulmonary vascularity are normal. There is peribronchial thickening with slight accentuation of the interstitial markings with a small air scarring at the left lung base. No effusions. No significant osseous abnormality.  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 No acute abnormalities.   Electronically Signed   By: Geanie Cooley M.D.   On: 10/31/2013 07:28   Ct Head Wo Contrast  10/30/2013   ADDENDUM REPORT: 10/30/2013 13:49  ADDENDUM: Critical Value/emergent results were called by telephone at the time of interpretation on 10/30/2013 at 1:48 PM to Dr.[name] , who verbally acknowledged these results.   Electronically Signed   By: Elige Ko   On: 10/30/2013 13:49   10/30/2013   CLINICAL DATA:  Right-sided numbness  EXAM: CT HEAD WITHOUT CONTRAST  TECHNIQUE: Contiguous axial images were obtained from the base of the skull through the vertex without intravenous contrast.  COMPARISON:  MR brain 06/09/2009  FINDINGS: There is no evidence of mass effect, midline shift or extra-axial fluid collections. There is no evidence of a space-occupying lesion or intracranial hemorrhage. There is no evidence of a cortical-based area of acute infarction.  The ventricles and sulci are appropriate for the patient's age. The basal cisterns are patent.  Visualized portions of the orbits are unremarkable. The visualized portions of the  paranasal sinuses and mastoid air cells are unremarkable.  The osseous structures are unremarkable.  IMPRESSION: No acute intracranial pathology.  Electronically Signed: By: Elige Ko On: 10/30/2013 13:36   Mr Maxine Glenn Head Wo Contrast  10/30/2013   CLINICAL DATA:  Right-sided numbness. Evaluate for aneurysm versus stroke.  EXAM: MRI HEAD WITHOUT CONTRAST  MRA HEAD WITHOUT CONTRAST  TECHNIQUE: Multiplanar, multiecho pulse sequences of the brain and surrounding structures were obtained without intravenous contrast. Angiographic images of the head were obtained using MRA technique without contrast.  COMPARISON:  Head CT 10/30/2013.  Brain MRI and MRA 06/09/2009.  FINDINGS: MRI HEAD FINDINGS  There are scattered, small foci of T2 hyperintensity within the subcortical and deep cerebral white matter bilaterally, mildly increased since the prior MRI. Ventricles and sulci are normal. There is no evidence of acute infarct, intracranial hemorrhage, mass, midline shift, or extra-axial fluid collection. Orbits are unremarkable. Minimal bilateral maxillary sinus mucosal thickening is noted.  MRA HEAD FINDINGS  Visualized distal vertebral arteries are patent. Left vertebral artery is slightly dominant. PICA origins are patent. Basilar artery is patent without stenosis. Common origin of the SCAs and PCAs. PCAs are patent without evidence of stenosis. Internal carotid arteries are patent from skullbase to carotid terminus. ACA and MCA origins are patent. MCAs are unremarkable bilaterally. Left ACA is hypoplastic. ACAs are otherwise unremarkable. Anterior and posterior communicating arteries are not identified.  IMPRESSION: 1. No evidence of acute infarct or other acute intracranial abnormality. 2. Scattered foci of white matter T2 signal abnormality, slightly increased from prior study and suggestive of mild chronic small vessel ischemic disease. 3. Unremarkable intracranial MRA without  evidence of high-grade stenosis or  aneurysm.   Electronically Signed   By: Sebastian Ache   On: 10/30/2013 15:30   Mr Brain Wo Contrast  10/30/2013   CLINICAL DATA:  Right-sided numbness. Evaluate for aneurysm versus stroke.  EXAM: MRI HEAD WITHOUT CONTRAST  MRA HEAD WITHOUT CONTRAST  TECHNIQUE: Multiplanar, multiecho pulse sequences of the brain and surrounding structures were obtained without intravenous contrast. Angiographic images of the head were obtained using MRA technique without contrast.  COMPARISON:  Head CT 10/30/2013.  Brain MRI and MRA 06/09/2009.  FINDINGS: MRI HEAD FINDINGS  There are scattered, small foci of T2 hyperintensity within the subcortical and deep cerebral white matter bilaterally, mildly increased since the prior MRI. Ventricles and sulci are normal. There is no evidence of acute infarct, intracranial hemorrhage, mass, midline shift, or extra-axial fluid collection. Orbits are unremarkable. Minimal bilateral maxillary sinus mucosal thickening is noted.  MRA HEAD FINDINGS  Visualized distal vertebral arteries are patent. Left vertebral artery is slightly dominant. PICA origins are patent. Basilar artery is patent without stenosis. Common origin of the SCAs and PCAs. PCAs are patent without evidence of stenosis. Internal carotid arteries are patent from skullbase to carotid terminus. ACA and MCA origins are patent. MCAs are unremarkable bilaterally. Left ACA is hypoplastic. ACAs are otherwise unremarkable. Anterior and posterior communicating arteries are not identified.  IMPRESSION: 1. No evidence of acute infarct or other acute intracranial abnormality. 2. Scattered foci of white matter T2 signal abnormality, slightly increased from prior study and suggestive of mild chronic small vessel ischemic disease. 3. Unremarkable intracranial MRA without evidence of high-grade stenosis or aneurysm.   Electronically Signed   By: Sebastian Ache   On: 10/30/2013 15:30    MEDICATIONS                                                                                                                        I have reviewed the patient's current medications.  ASSESSMENT/PLAN:                                                                                                           Complicated migraine. Continue current treatment. Needs outpatient neurology follow up in 2-4 weeks. Please, call neurology with questions or concerns.   Wyatt Portela, MD [facility] [phone]  10/31/2013, 8:36 AM

## 2013-10-31 NOTE — Plan of Care (Signed)
Problem: Consults Goal: Nutrition Consult-if indicated Outcome: Progressing Pt was able to tolerate regular diet.

## 2013-10-31 NOTE — Discharge Summary (Signed)
Physician Discharge Summary  Timothy Griffin ZOX:[id number] DOB: 1961/04/18 DOA: 10/30/2013  PCP: Timothy Putt, MD  Admit date: 10/30/2013 Discharge date: 10/31/2013  Time spent: Less than 30 minutes  Recommendations for Outpatient Follow-up:  1. Dr. Yates Griffin, PCP in 1 week. 2. Timothy Griffin in 2-4 weeks.  Discharge Diagnoses:  Active Problems:   TIA (transient ischemic attack)   Hypertension   Discharge Condition: Improved & Stable  Diet recommendation: Heart Healthy diet.  Filed Weights   10/31/13 0923  Weight: 111.812 kg (246 lb 8 oz)    History of present illness & Hospital course:  52 y/o male with h/o HTN, previously diagnosed "TIA", no known prior migraines, presented with right sided numbness & tingling that started in his right arm on the morning of admission and then slowly over the course of hours spread up to his face. He developed a throbbing headache associated with photophobia & blurred vision in right eye. Neurology was consulted. They opined that the progression of symptoms, as was positive symptoms (tingling) and persistent symptoms in the setting of a negative brain MRI would argue strongly that this represented complicated migraine rather than TIA. Dr. Amada Griffin recommended continuing ASA 81 mg for prior presumed TIA's which may have been complicated migraine episodes too, although there was no way to be sure. He recommended no further TIA work up. He was treated for migraine with Compazine and Toradol. His BP was transiently elevated likely secondary to pain. On further enquiry, he was not sure regarding having chest pain and thought that it seemed due to his right sided symptoms. On day of DC, his headache has almost resolved. He did not have any tingling of his RUE or face. He had some intermittent fleeting mild tingling of left fingers. Neurology saw him in follow up and cleared him for DC. Discussed with Neurology: Since his episodes are  infrequent, they did not recommend maintenance medications and recommended PRN Ibuprofen for future episodes.  Consultations:  Neurology  Procedures:  None   Discharge Exam:  Complaints: Headache almost resolved. Mild intermittent numbness left hand but none in right hand.  Filed Vitals:   10/31/13 0600 10/31/13 0923 10/31/13 1021 10/31/13 1203  BP: 160/88  156/88 155/78  Pulse: 106  55 56  Temp: 98.1 F (36.7 C)   98 F (36.7 C)  TempSrc: Oral   Oral  Resp: 18  18 18   Height:  5\' 11"  (1.803 m)    Weight:  111.812 kg (246 lb 8 oz)    SpO2: 97%  97% 97%    General exam: Moderately built and nourished male lying in bed comfortably. Respiratory system: Clear. No increased work of breathing. Cardiovascular system: S1 & S2 heard, RRR. No JVD, murmurs, gallops, clicks or pedal edema. Tele: Mostly SB in 50's-SR. Some SB in 40's yesterday Gastrointestinal system: Abdomen is nondistended, soft and nontender. Normal bowel sounds heard. Central nervous system: Alert and oriented. No focal neurological deficits. Extremities: Symmetric 5 x 5 power.  Discharge Instructions      Discharge Orders   Future Orders Complete By Expires   Call MD for:  severe uncontrolled pain  As directed    Call MD for:  As directed    Comments:     Stroke like symptoms.   Diet - low sodium heart healthy  As directed    Discharge instructions  As directed    Comments:     If you have no further symptoms, you may return  to work without restrictions on 11/02/2013.   Increase activity slowly  As directed        Medication List         aspirin EC 81 MG tablet  Take 1 tablet (81 mg total) by mouth daily.     diphenhydrAMINE 25 MG tablet  Commonly known as:  BENADRYL  Take 25 mg by mouth every 6 (six) hours as needed (allergies).     ibuprofen 200 MG tablet  Commonly known as:  MOTRIN IB  Take 2-3 tablets (400-600 mg total) by mouth every 6 (six) hours as needed for headache.        Follow-up Information   Follow up with Timothy Putt, MD. Schedule an appointment as soon as possible for a visit in 1 week.   Specialty:  Internal Medicine   Contact information:   [facility] [address] [place] [address] [phone]       Follow up with Timothy Griffin. Schedule an appointment as soon as possible for a visit in 2 weeks.   Contact information:   [address] [place] [address] [phone]       The results of significant diagnostics from this hospitalization (including imaging, microbiology, ancillary and laboratory) are listed below for reference.    Significant Diagnostic Studies: Dg Chest 2 View  10/31/2013   CLINICAL DATA:  Transient ischemic attack.  EXAM: CHEST  2 VIEW  COMPARISON:  06/09/2009  FINDINGS: The heart size and pulmonary vascularity are normal. There is peribronchial thickening with slight accentuation of the interstitial markings with a small air scarring at the left lung base. No effusions. No significant osseous abnormality.  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 No acute abnormalities.   Electronically Signed   By: Timothy Griffin M.D.   On: 10/31/2013 07:28   Ct Head Wo Contrast  10/30/2013   ADDENDUM REPORT: 10/30/2013 13:49  ADDENDUM: Critical Value/emergent results were called by telephone at the time of interpretation on 10/30/2013 at 1:48 PM to Dr.IVA Griffin , who verbally acknowledged these results.   Electronically Signed   By: Elige Ko   On: 10/30/2013 13:49   10/30/2013   CLINICAL DATA:  Right-sided numbness  EXAM: CT HEAD WITHOUT CONTRAST  TECHNIQUE: Contiguous axial images were obtained from the base of the skull through the vertex without intravenous contrast.  COMPARISON:  MR brain 06/09/2009  FINDINGS: There is no evidence of mass effect, midline shift or extra-axial fluid collections. There is no evidence of a space-occupying lesion or  intracranial hemorrhage. There is no evidence of a cortical-based area of acute infarction.  The ventricles and sulci are appropriate for the patient's age. The basal cisterns are patent.  Visualized portions of the orbits are unremarkable. The visualized portions of the paranasal sinuses and mastoid air cells are unremarkable.  The osseous structures are unremarkable.  IMPRESSION: No acute intracranial pathology.  Electronically Signed: By: Elige Ko On: 10/30/2013 13:36   Mr Maxine Glenn Head Wo Contrast  10/30/2013   CLINICAL DATA:  Right-sided numbness. Evaluate for aneurysm versus stroke.  EXAM: MRI HEAD WITHOUT CONTRAST  MRA HEAD WITHOUT CONTRAST  TECHNIQUE: Multiplanar, multiecho pulse sequences of the brain and surrounding structures were obtained without intravenous contrast. Angiographic images of the head were obtained using MRA technique without contrast.  COMPARISON:  Head CT 10/30/2013.  Brain MRI and MRA 06/09/2009.  FINDINGS: MRI HEAD FINDINGS  There are scattered, small foci of T2  hyperintensity within the subcortical and deep cerebral white matter bilaterally, mildly increased since the prior MRI. Ventricles and sulci are normal. There is no evidence of acute infarct, intracranial hemorrhage, mass, midline shift, or extra-axial fluid collection. Orbits are unremarkable. Minimal bilateral maxillary sinus mucosal thickening is noted.  MRA HEAD FINDINGS  Visualized distal vertebral arteries are patent. Left vertebral artery is slightly dominant. PICA origins are patent. Basilar artery is patent without stenosis. Common origin of the SCAs and PCAs. PCAs are patent without evidence of stenosis. Internal carotid arteries are patent from skullbase to carotid terminus. ACA and MCA origins are patent. MCAs are unremarkable bilaterally. Left ACA is hypoplastic. ACAs are otherwise unremarkable. Anterior and posterior communicating arteries are not identified.  IMPRESSION: 1. No evidence of acute infarct or  other acute intracranial abnormality. 2. Scattered foci of white matter T2 signal abnormality, slightly increased from prior study and suggestive of mild chronic small vessel ischemic disease. 3. Unremarkable intracranial MRA without evidence of high-grade stenosis or aneurysm.   Electronically Signed   By: Sebastian Ache   On: 10/30/2013 15:30    Microbiology: No results found for this or any previous visit (from the past 240 hour(s)).   Labs: Basic Metabolic Panel:  Recent Labs Lab 10/30/13 1601  NA 136  K 3.7  CL 102  CO2 23  GLUCOSE 104*  BUN 10  CREATININE 0.91  CALCIUM 9.3   Liver Function Tests:  Recent Labs Lab 10/30/13 1601  AST 20  ALT 18  ALKPHOS 70  BILITOT 0.5  PROT 7.3  ALBUMIN 4.2   No results found for this basename: LIPASE, AMYLASE,  in the last 168 hours No results found for this basename: AMMONIA,  in the last 168 hours CBC:  Recent Labs Lab 10/30/13 1601  WBC 10.4  NEUTROABS 6.7  HGB 15.5  HCT 44.9  MCV 88.6  PLT 299   Cardiac Enzymes:  Recent Labs Lab 10/30/13 1601 10/30/13 1821 10/31/13 0235  TROPONINI <0.30 <0.30 <0.30   BNP: BNP (last 3 results) No results found for this basename: PROBNP,  in the last 8760 hours CBG:  Recent Labs Lab 10/30/13 1621  GLUCAP 80    Signed:  Kaelah Hayashi, MD, FACP, FHM. [facility] Pager [phone]  If 7PM-7AM, please contact night-coverage [email or web address] Password [email or web address] 10/31/2013, 1:11 PM

## 2015-04-28 IMAGING — CT CT HEAD W/O CM
2 series · 15 of 30 positions shown, 19 images · non-contrast
Comparison: MR brain 06/09/2009

ADDENDUM:
Critical Value/emergent results were called by telephone at the time
of interpretation on 10/30/2013 at [DATE] to Dr.RYOUTARO YADORI , who
verbally acknowledged these results.
CLINICAL DATA: Right-sided numbness

EXAM:
CT HEAD WITHOUT CONTRAST
TECHNIQUE: Contiguous axial images were obtained from the base of the skull
through the vertex without intravenous contrast.

[Series 2: head w/o · axial · non-contrast · 0.48mm/px · z∈[-90,+35]mm · 13 of 31 slices shown, 17 images]
[im 3/31  brain]
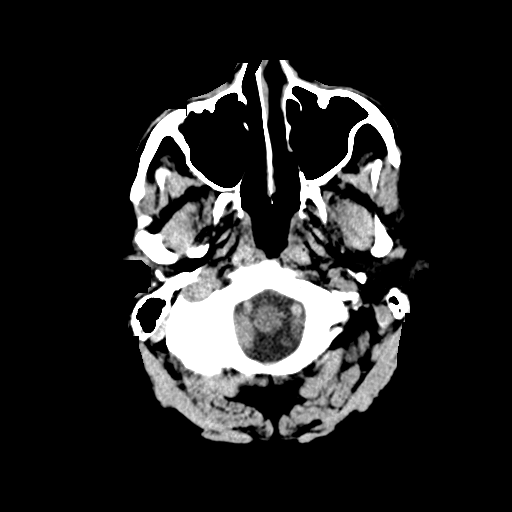
[im 3/31  bone]
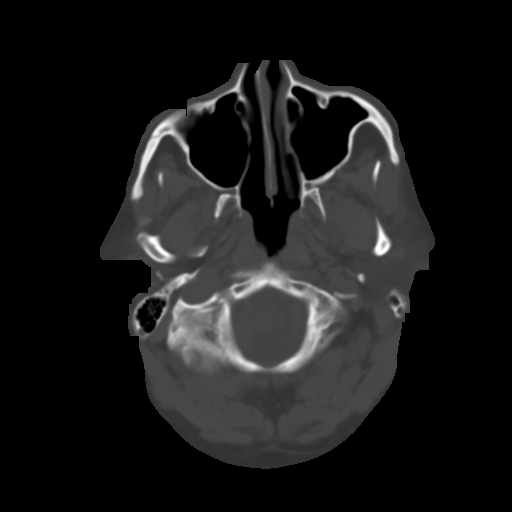
[im 5/31  brain]
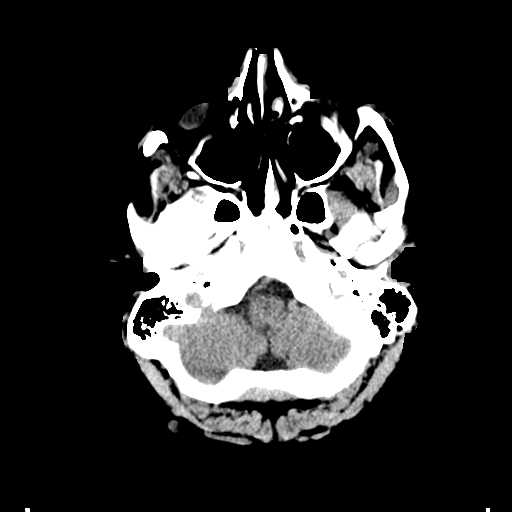
[im 7/31  brain]
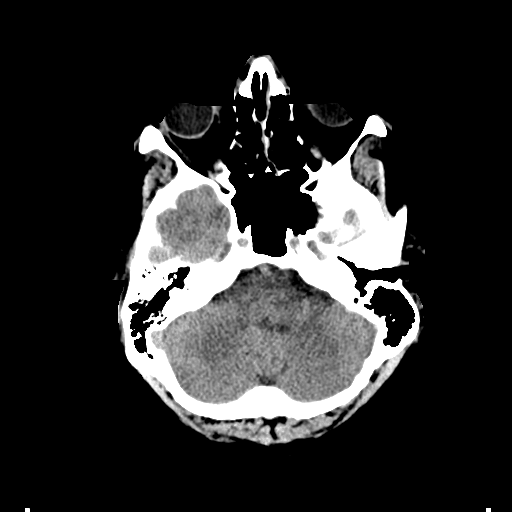
[im 9/31  brain]
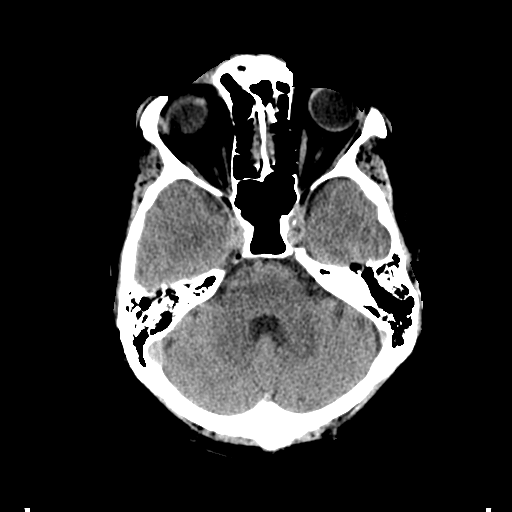
[im 11/31  brain]
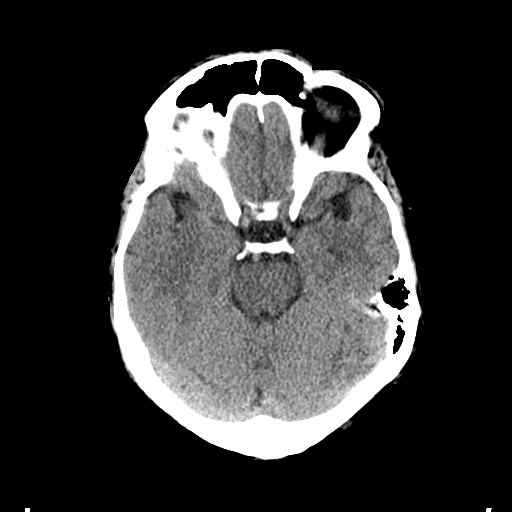
[im 11/31  bone]
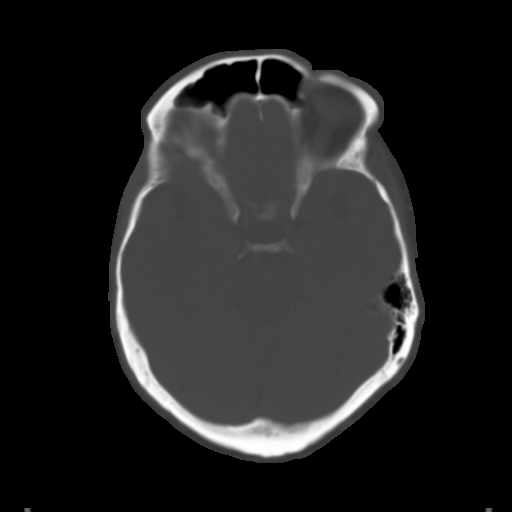
[im 13/31  brain]
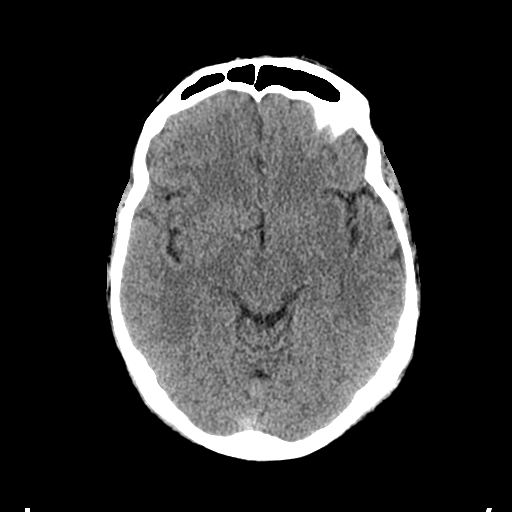
[im 16/31  brain]
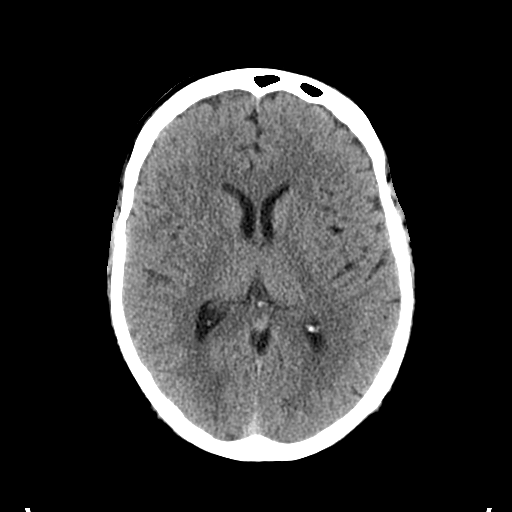
[im 18/31  brain]
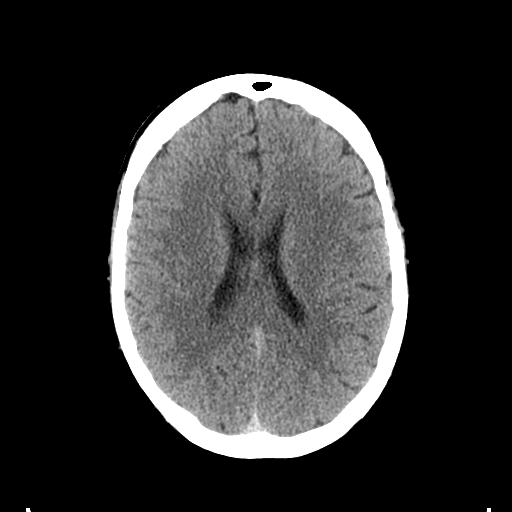
[im 20/31  brain]
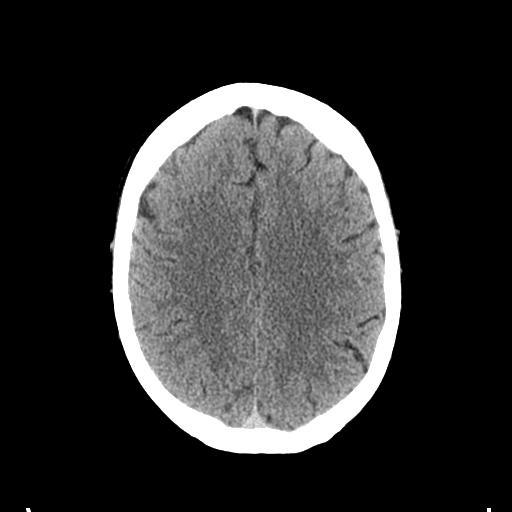
[im 20/31  bone]
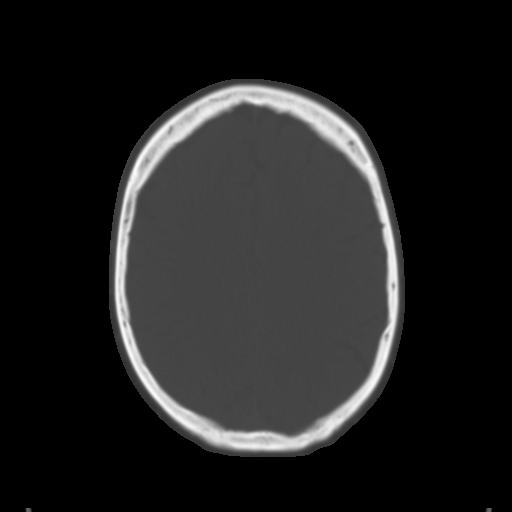
[im 22/31  brain]
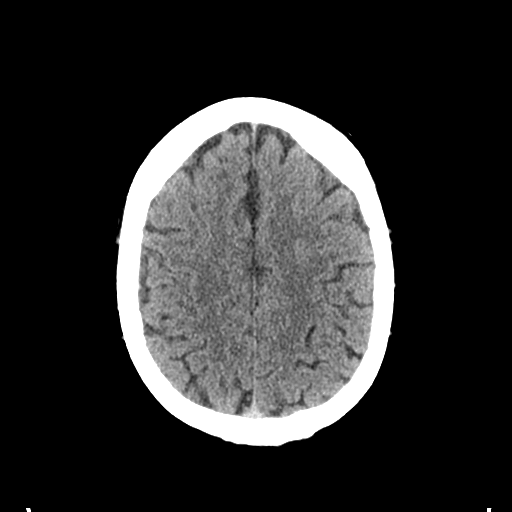
[im 24/31  brain]
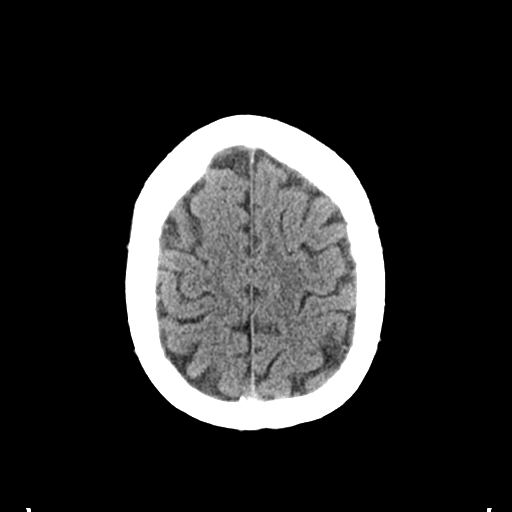
[im 26/31  brain]
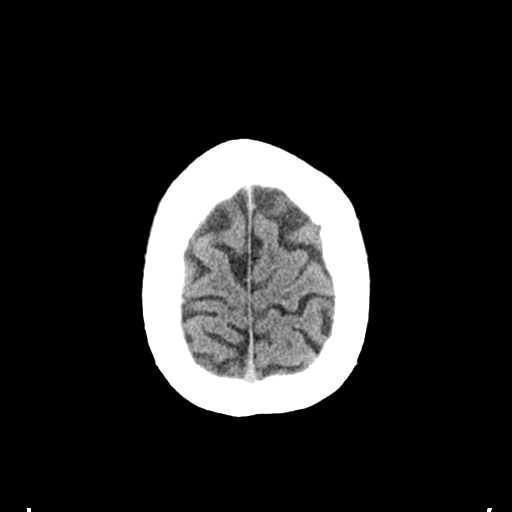
[im 28/31  brain]
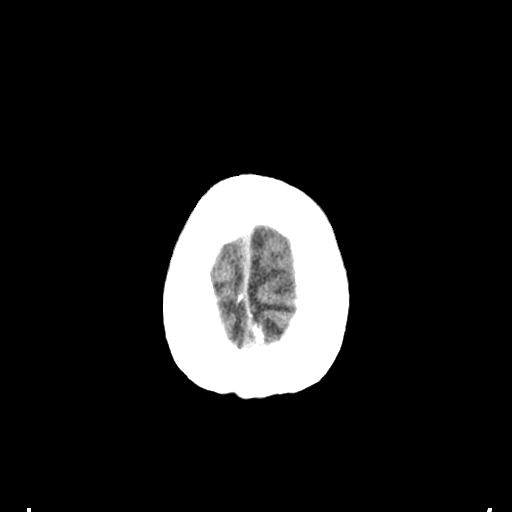
[im 28/31  bone]
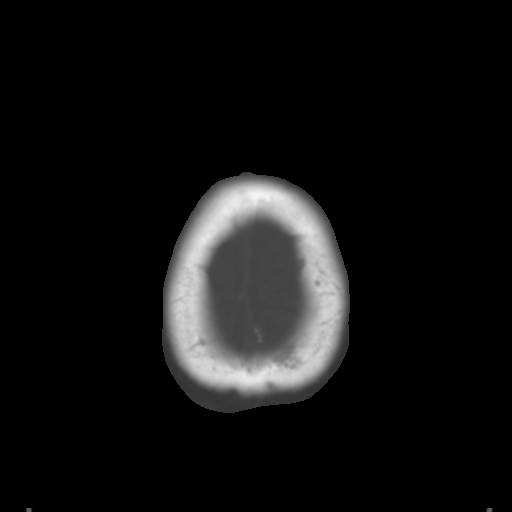

[Series 3: bone windows · axial · 0.48mm/px · z∈[-90,-70]mm · 2 of 31 slices shown]
[im 3/31  bone]
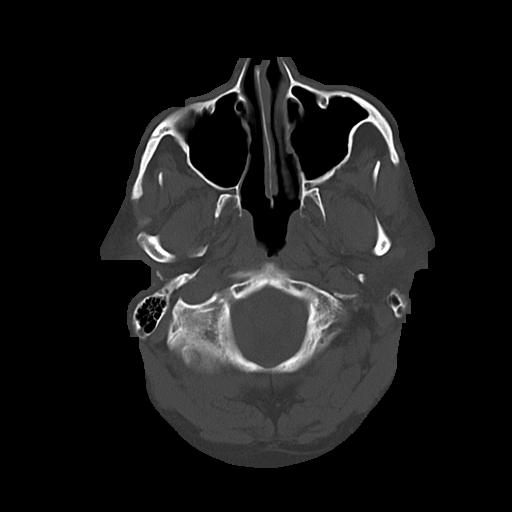
[im 7/31  bone]
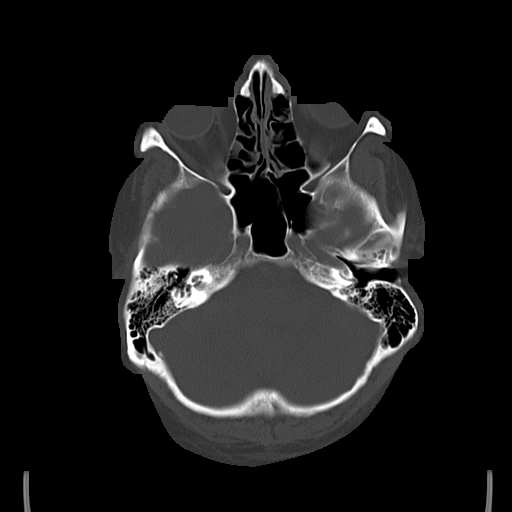

[15 of 30 positions shown; findings below may reference images not displayed]

FINDINGS: There is no evidence of mass effect, midline shift or extra-axial
fluid collections. There is no evidence of a space-occupying lesion
or intracranial hemorrhage. There is no evidence of a cortical-based
area of acute infarction.

The ventricles and sulci are appropriate for the patient's age. The
basal cisterns are patent.

Visualized portions of the orbits are unremarkable. The visualized
portions of the paranasal sinuses and mastoid air cells are
unremarkable.

The osseous structures are unremarkable.
IMPRESSION: No acute intracranial pathology.

## 2020-02-25 ENCOUNTER — Ambulatory Visit: Payer: Self-pay | Admitting: Nurse Practitioner

## 2022-11-23 ENCOUNTER — Other Ambulatory Visit: Payer: Self-pay

## 2022-11-23 ENCOUNTER — Emergency Department: Payer: 59

## 2022-11-23 ENCOUNTER — Emergency Department
Admission: EM | Admit: 2022-11-23 | Discharge: 2022-11-23 | Disposition: A | Discharge status: Home / Self Care | Payer: 59 | Attending: Emergency Medicine | Admitting: Emergency Medicine

## 2022-11-23 DIAGNOSIS — M5441 Lumbago with sciatica, right side: Secondary | ICD-10-CM | POA: Diagnosis not present

## 2022-11-23 DIAGNOSIS — Y92410 Unspecified street and highway as the place of occurrence of the external cause: Secondary | ICD-10-CM | POA: Insufficient documentation

## 2022-11-23 DIAGNOSIS — R600 Localized edema: Secondary | ICD-10-CM | POA: Insufficient documentation

## 2022-11-23 DIAGNOSIS — M549 Dorsalgia, unspecified: Secondary | ICD-10-CM

## 2022-11-23 DIAGNOSIS — M542 Cervicalgia: Secondary | ICD-10-CM | POA: Diagnosis not present

## 2022-11-23 DIAGNOSIS — M79604 Pain in right leg: Secondary | ICD-10-CM | POA: Diagnosis not present

## 2022-11-23 DIAGNOSIS — I10 Essential (primary) hypertension: Secondary | ICD-10-CM | POA: Insufficient documentation

## 2022-11-23 DIAGNOSIS — R519 Headache, unspecified: Secondary | ICD-10-CM

## 2022-11-23 DIAGNOSIS — M546 Pain in thoracic spine: Secondary | ICD-10-CM | POA: Insufficient documentation

## 2022-11-23 LAB — BASIC METABOLIC PANEL
Anion gap: 10 (ref 5–15)
BUN: 17 mg/dL (ref 8–23)
CO2: 23 mmol/L (ref 22–32)
Calcium: 9.2 mg/dL (ref 8.9–10.3)
Chloride: 108 mmol/L (ref 98–111)
Creatinine, Ser: 0.91 mg/dL (ref 0.61–1.24)
GFR, Estimated: >60 mL/min (ref >60)
Glucose, Bld: 92 mg/dL (ref 70–99)
Potassium: 4.1 mmol/L (ref 3.5–5.1)
Sodium: 141 mmol/L (ref 135–145)

## 2022-11-23 LAB — BRAIN NATRIURETIC PEPTIDE: B Natriuretic Peptide: 98.8 pg/mL (ref 0.0–100.0)

## 2022-11-23 LAB — TROPONIN I (HIGH SENSITIVITY): Troponin I (High Sensitivity): 7 ng/L (ref <18)

## 2022-11-23 LAB — CBC WITH DIFFERENTIAL/PLATELET
Abs Immature Granulocytes: 0.04 10*3/uL (ref 0.00–0.07)
Basophils Absolute: 0.1 10*3/uL (ref 0.0–0.1)
Basophils Relative: 1 %
Eosinophils Absolute: 0.5 10*3/uL (ref 0.0–0.5)
Eosinophils Relative: 5 %
HCT: 44.2 % (ref 39.0–52.0)
Hemoglobin: 14.6 g/dL (ref 13.0–17.0)
Immature Granulocytes: 0 %
Lymphocytes Relative: 23 %
Lymphs Abs: 2.3 10*3/uL (ref 0.7–4.0)
MCH: 29.2 pg (ref 26.0–34.0)
MCHC: 33 g/dL (ref 30.0–36.0)
MCV: 88.4 fL (ref 80.0–100.0)
Monocytes Absolute: 0.7 10*3/uL (ref 0.1–1.0)
Monocytes Relative: 7 %
Neutro Abs: 6.5 10*3/uL (ref 1.7–7.7)
Neutrophils Relative %: 64 %
Platelets: 333 10*3/uL (ref 150–400)
RBC: 5 MIL/uL (ref 4.22–5.81)
RDW: 15 % (ref 11.5–15.5)
WBC: 10.1 10*3/uL (ref 4.0–10.5)
nRBC: 0 % (ref 0.0–0.2)

## 2022-11-23 MED ORDER — CYCLOBENZAPRINE HCL 10 MG PO TABS: 10.0000 mg | ORAL_TABLET | Freq: Three times a day (TID) | ORAL | 0 refills | Status: DC | PRN

## 2022-11-23 MED ORDER — PREDNISONE 20 MG PO TABS: 20.0000 mg | ORAL_TABLET | Freq: Every day | ORAL | 0 refills | Status: DC

## 2022-11-23 MED ORDER — OXYCODONE-ACETAMINOPHEN 5-325 MG PO TABS: 1.0000 | ORAL_TABLET | ORAL | 0 refills | Status: DC | PRN
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
## 2022-11-23 NOTE — ED Triage Notes (Signed)
Pt comes with c/o hypertension numbness and headache for few days. Pt states he is also having back pain.
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
## 2022-12-15 ENCOUNTER — Encounter: Payer: Self-pay | Admitting: Orthopedic Surgery

## 2022-12-15 ENCOUNTER — Ambulatory Visit (INDEPENDENT_AMBULATORY_CARE_PROVIDER_SITE_OTHER): Payer: No Typology Code available for payment source | Admitting: Orthopedic Surgery

## 2022-12-15 VITALS — BP 158/90 | Ht 71.0 in | Wt 236.2 lb

## 2022-12-15 DIAGNOSIS — M5416 Radiculopathy, lumbar region: Secondary | ICD-10-CM

## 2022-12-15 DIAGNOSIS — M4726 Other spondylosis with radiculopathy, lumbar region: Secondary | ICD-10-CM

## 2022-12-15 DIAGNOSIS — Q892 Congenital malformations of other endocrine glands: Secondary | ICD-10-CM

## 2022-12-15 DIAGNOSIS — M47816 Spondylosis without myelopathy or radiculopathy, lumbar region: Secondary | ICD-10-CM

## 2022-12-15 DIAGNOSIS — M47812 Spondylosis without myelopathy or radiculopathy, cervical region: Secondary | ICD-10-CM

## 2022-12-15 MED ORDER — TRAMADOL HCL 50 MG PO TABS: 50.0000 mg | ORAL_TABLET | Freq: Two times a day (BID) | ORAL | 0 refills | Status: AC | PRN

## 2022-12-15 NOTE — Patient Instructions (Signed)
It was so nice to see you today, I am sorry that you are hurting so much.   CT of your neck showed a thyroglossal cyst- I want you to see [facility] for this. I sent a referral. Also showed some wear and tear in your neck (arthritis).   Your lower back CT also showed some wear and tear (arthritis).  I sent physical therapy orders to [facility], they should call you to schedule. You can call them at [phone] if you don't hear from them.   Follow up with your PCP regarding your blood pressure. Keep checking it at home as well. If you have any chest pain, shortness of breath, blurry vision, or headaches then you need to go to ED.   I do not want you taking any motrin as it can elevate your blood pressure even more. You can discuss further with your PCP.   I sent a new prescription for tramadol to your pharmacy for pain. Take only as needed. This can make you sleepy and/or constipated.   If no improvement with PT, we can get an MRI of your lower back to consider injections.   We have a phone visit scheduled in 6 weeks, but let me know if you need anything prior to this.   Please do not hesitate to call if you have any questions or concerns. You can also message me in [place].   If you have not heard back about any of the tests/procedures in the next week, please call the office so we can help you get these things scheduled.   Geronimo Boot PA-C [phone]

## 2023-01-31 ENCOUNTER — Telehealth: Payer: Self-pay | Admitting: Orthopedic Surgery

## 2023-01-31 ENCOUNTER — Other Ambulatory Visit: Payer: Self-pay | Admitting: Student

## 2023-01-31 DIAGNOSIS — R911 Solitary pulmonary nodule: Secondary | ICD-10-CM

## 2023-01-31 NOTE — Telephone Encounter (Signed)
-----   Message from Peggyann Shoals sent at 01/31/2023 10:36 AM EST ----- Patient said that he no longer needs PT because he is feeling and doing better. ----- Message ----- From: Geronimo Boot, PA-C Sent: 01/31/2023  10:22 AM EST To: Peggyann Shoals  He has appt with me on Thursday to f/u after PT. Looks like I put in PT orders for [facility] and he has not been seen yet.   Please call and see if I need to put in PT orders somewhere else. [facility]?   Recommend pushing his appt back with me until after he starts PT, but if he wants to keep it on Thursday then that's fine.   Thanks.

## 2023-01-31 NOTE — Progress Notes (Deleted)
   Telephone Visit- Progress Note: Referring Physician:  Gildardo Pounds, NP [address] [place],  [place] [address]  Primary Physician:  Gildardo Pounds, NP  This visit was performed via telephone.  Patient location: home Provider location: office  I spent a total of *** minutes non-face-to-face activities for this visit on the date of this encounter including review of current clinical condition and response to treatment.    Patient has given verbal consent to this telephone visits and we reviewed the limitations of a telephone visit. Patient wishes to proceed.    Chief Complaint:  ***  History of Present Illness: JAYVION KINSLER is a 62 y.o. male has a history of ***.     Exam: No exam done as this was a telephone encounter.     Imaging: ***  I have personally reviewed the images and agree with the above interpretation.  Assessment and Plan: Mr. Koller is a pleasant 62 y.o. male with ***  Treatment options discussed with patient and following plan made:   - Order for physical therapy for *** spine ***. Patient to call to schedule appointment. *** - Continue current medications including ***. Reviewed dosing and side effects.  - Prescription for ***. Reviewed dosing and side effects. Take with food.  - Prescription for *** to take prn muscle spasms. Reviewed dosing and side effects. Discussed this can cause drowsiness.  - MRI of *** to further evaluate *** radiculopathy. No improvement time or medications (***).  - Referral to PMR at [facility] to discuss possible *** injections.  - Will schedule phone visit to review MRI results once I get them back.   Geronimo Boot PA-C Neurosurgery

## 2023-02-03 ENCOUNTER — Telehealth: Payer: No Typology Code available for payment source | Admitting: Orthopedic Surgery

## 2023-02-24 ENCOUNTER — Ambulatory Visit
Admission: RE | Admit: 2023-02-24 | Discharge: 2023-02-24 | Disposition: A | Discharge status: Home / Self Care | Payer: No Typology Code available for payment source | Source: Ambulatory Visit | Attending: Student | Admitting: Student

## 2023-02-24 DIAGNOSIS — R911 Solitary pulmonary nodule: Secondary | ICD-10-CM

## 2023-03-28 ENCOUNTER — Ambulatory Visit: Payer: 59 | Admitting: Cardiology

## 2023-04-27 ENCOUNTER — Other Ambulatory Visit: Payer: Self-pay | Admitting: Student

## 2023-04-27 DIAGNOSIS — R911 Solitary pulmonary nodule: Secondary | ICD-10-CM

## 2023-05-13 ENCOUNTER — Ambulatory Visit
Admission: RE | Admit: 2023-05-13 | Discharge: 2023-05-13 | Disposition: A | Discharge status: Home / Self Care | Payer: No Typology Code available for payment source | Source: Ambulatory Visit | Attending: Student | Admitting: Student

## 2023-05-13 DIAGNOSIS — R911 Solitary pulmonary nodule: Secondary | ICD-10-CM | POA: Diagnosis present
